# Patient Record
Sex: Female | Born: 1995 | Race: Black or African American | Hispanic: No | Marital: Single | Smoking: Never smoker
Health system: Southern US, Community
[De-identification: ages and names within clinical notes are randomized; demographics above are authoritative.]

## PROBLEM LIST (undated history)

## (undated) DIAGNOSIS — J45909 Unspecified asthma, uncomplicated: Secondary | ICD-10-CM

## (undated) DIAGNOSIS — L509 Urticaria, unspecified: Secondary | ICD-10-CM

## (undated) DIAGNOSIS — L309 Dermatitis, unspecified: Secondary | ICD-10-CM

## (undated) HISTORY — PX: HERNIA REPAIR: SHX51

## (undated) HISTORY — PX: HEMANGIOMA EXCISION: SHX1734

## (undated) HISTORY — DX: Urticaria, unspecified: L50.9

## (undated) HISTORY — DX: Dermatitis, unspecified: L30.9

---
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
## 2017-10-06 ENCOUNTER — Ambulatory Visit: Payer: 59 | Admitting: Allergy and Immunology

## 2017-10-06 ENCOUNTER — Encounter: Payer: Self-pay | Admitting: Allergy and Immunology

## 2017-10-06 VITALS — BP 90/66 | HR 85 | Temp 98.7°F | Resp 20 | Ht 59.84 in | Wt 152.6 lb

## 2017-10-06 DIAGNOSIS — L500 Allergic urticaria: Secondary | ICD-10-CM | POA: Insufficient documentation

## 2017-10-06 DIAGNOSIS — L299 Pruritus, unspecified: Secondary | ICD-10-CM

## 2017-10-06 DIAGNOSIS — J4530 Mild persistent asthma, uncomplicated: Secondary | ICD-10-CM

## 2017-10-06 DIAGNOSIS — J3089 Other allergic rhinitis: Secondary | ICD-10-CM

## 2017-10-06 MED ORDER — AZELASTINE HCL 0.1 % NA SOLN: NASAL | 5 refills | Status: DC

## 2017-10-06 MED ORDER — ALBUTEROL SULFATE HFA 108 (90 BASE) MCG/ACT IN AERS: INHALATION_SPRAY | RESPIRATORY_TRACT | 1 refills | Status: AC

## 2017-10-06 MED ORDER — MONTELUKAST SODIUM 10 MG PO TABS: 10.0000 mg | ORAL_TABLET | Freq: Every day | ORAL | 5 refills | Status: DC

## 2017-10-06 MED ORDER — CARBINOXAMINE MALEATE 6 MG PO TABS: 1.0000 | ORAL_TABLET | Freq: Three times a day (TID) | ORAL | 3 refills | Status: AC

## 2017-10-06 MED ORDER — MONTELUKAST SODIUM 10 MG PO TABS: ORAL_TABLET | ORAL | 5 refills | Status: DC

## 2017-10-06 MED ORDER — LEVOCETIRIZINE DIHYDROCHLORIDE 5 MG PO TABS: ORAL_TABLET | ORAL | 5 refills | Status: DC

## 2017-10-06 NOTE — Patient Instructions (Addendum)
Urticaria Unclear etiology. Skin tests to select food allergens were negative today.  Negative predictive value food allergen skin testing is excellent, approximately 95%.  The timing of symptom onset relative to the consumption of strawberry, approximately 12-18 hours later, makes this correlation unlikely. NSAIDs and emotional stress commonly exacerbate urticaria but are not the underlying etiology in this case. Physical urticarias are negative by history (i.e. pressure-induced, temperature, vibration, solar, etc.). There are no concomitant symptoms concerning for anaphylaxis or constitutional symptoms suggestive of underlying malignancy.   We will not order labs at this time, however, if lesions recur, persist, progress, or change in character, we will assess potential etiologies with screening labs.  For symptom relief, patient is to take oral antihistamines as directed.  A prescription has been provided for levocetirizine, 5 mg daily as needed.  Should symptoms recur, a journal is to be kept recording any foods eaten, beverages consumed, medications taken within a 6 hour period prior to the onset of symptoms, as well as record activities being performed, and environmental conditions. For any symptoms concerning for anaphylaxis, 911 is to be called immediately.  Perennial and seasonal allergic rhinitis  Aeroallergen avoidance measures have been discussed and provided in written form.  A prescription has been provided for azelastine nasal spray, 1-2 sprays per nostril 2 times daily as needed. Proper nasal spray technique has been discussed and demonstrated.   Nasal saline spray (i.e., Simply Saline) or nasal saline lavage (i.e., NeilMed) is recommended as needed and prior to medicated nasal sprays.  A prescription has been provided for RyVent (carbinoxamine maleate) 6mg  every 6-8 hours as needed.  If allergen avoidance measures and medications fail to adequately relieve symptoms, aeroallergen  immunotherapy will be considered.  Mild persistent asthma The patient's history suggests asthma and spirometry today confirms this diagnosis.  In addition, todays spirometry results, assessed while asymptomatic, suggest under-perception of bronchoconstriction.  A prescription has been provided for montelukast 10 mg daily at bedtime.  A prescription has been provided for albuterol HFA, 1-2 inhalations every 4-6 hours if needed.  Subjective and objective measures of pulmonary function will be followed and the treatment plan will be adjusted accordingly.   Return in about 3 months (around 01/06/2018), or if symptoms worsen or fail to improve.  Reducing Pollen Exposure  The American Academy of Allergy, Asthma and Immunology suggests the following steps to reduce your exposure to pollen during allergy seasons.    1. Do not hang sheets or clothing out to dry; pollen may collect on these items. 2. Do not mow lawns or spend time around freshly cut grass; mowing stirs up pollen. 3. Keep windows closed at night.  Keep car windows closed while driving. 4. Minimize morning activities outdoors, a time when pollen counts are usually at their highest. 5. Stay indoors as much as possible when pollen counts or humidity is high and on windy days when pollen tends to remain in the air longer. 6. Use air conditioning when possible.  Many air conditioners have filters that trap the pollen spores. 7. Use a HEPA room air filter to remove pollen form the indoor air you breathe.   Control of Mold Allergen  Mold and fungi can grow on a variety of surfaces provided certain temperature and moisture conditions exist.  Outdoor molds grow on plants, decaying vegetation and soil.  The major outdoor mold, Alternaria and Cladosporium, are found in very high numbers during hot and dry conditions.  Generally, a late Summer - Fall peak is seen  for common outdoor fungal spores.  Rain will temporarily lower outdoor mold spore  count, but counts rise rapidly when the rainy period ends.  The most important indoor molds are Aspergillus and Penicillium.  Dark, humid and poorly ventilated basements are ideal sites for mold growth.  The next most common sites of mold growth are the bathroom and the kitchen.  Outdoor MicrosoftMold Control 1. Use air conditioning and keep windows closed 2. Avoid exposure to decaying vegetation. 3. Avoid leaf raking. 4. Avoid grain handling. 5. Consider wearing a face mask if working in moldy areas.  Indoor Mold Control 1. Maintain humidity below 50%. 2. Clean washable surfaces with 5% bleach solution. 3. Remove sources e.g. Contaminated carpets.  Control of Dog or Cat Allergen  Avoidance is the best way to manage a dog or cat allergy. If you have a dog or cat and are allergic to dog or cats, consider removing the dog or cat from the home. If you have a dog or cat but don't want to find it a new home, or if your family wants a pet even though someone in the household is allergic, here are some strategies that may help keep symptoms at bay:  1. Keep the pet out of your bedroom and restrict it to only a few rooms. Be advised that keeping the dog or cat in only one room will not limit the allergens to that room. 2. Don't pet, hug or kiss the dog or cat; if you do, wash your hands with soap and water. 3. High-efficiency particulate air (HEPA) cleaners run continuously in a bedroom or living room can reduce allergen levels over time. 4. Place electrostatic material sheet in the air inlet vent in the bedroom. 5. Regular use of a high-efficiency vacuum cleaner or a central vacuum can reduce allergen levels. 6. Giving your dog or cat a bath at least once a week can reduce airborne allergen.

## 2017-10-06 NOTE — Assessment & Plan Note (Addendum)
Unclear etiology. Skin tests to select food allergens were negative today.  Negative predictive value food allergen skin testing is excellent, approximately 95%.  The timing of symptom onset relative to the consumption of strawberry, approximately 12-18 hours later, makes this correlation unlikely. NSAIDs and emotional stress commonly exacerbate urticaria but are not the underlying etiology in this case. Physical urticarias are negative by history (i.e. pressure-induced, temperature, vibration, solar, etc.). There are no concomitant symptoms concerning for anaphylaxis or constitutional symptoms suggestive of underlying malignancy.   We will not order labs at this time, however, if lesions recur, persist, progress, or change in character, we will assess potential etiologies with screening labs.  For symptom relief, patient is to take oral antihistamines as directed.  A prescription has been provided for levocetirizine, 5 mg daily as needed.  Should symptoms recur, a journal is to be kept recording any foods eaten, beverages consumed, medications taken within a 6 hour period prior to the onset of symptoms, as well as record activities being performed, and environmental conditions. For any symptoms concerning for anaphylaxis, 911 is to be called immediately.

## 2017-10-06 NOTE — Progress Notes (Signed)
New Patient Note  RE: Ariel SellersBrea Pelissier MRN: [id number] DOB: [birth year] Date of Office Visit: 10/06/2017  Referring provider: No ref. provider found Primary care provider: No primary care provider on file.  Chief Complaint: Allergic Reaction; Pruritus; Sinus Problem; and Breathing Problem   History of present illness: Ariel Brooks is a 21 y.o. female presenting today for evaluation of possible food allergies, rhinosinusitis, and breathing problems. She reports that approximately 2 weeks ago she woke up in the morning "itching all over."  She denies urticaria, angioedema, cardiopulmonary symptoms, and GI symptoms.  The pruritus lasted for approximately 3 days.  She took cetirizine with mild/moderate benefit.  No specific medication, food, skin care product, detergent, soap, or other environmental triggers have been identified.  The pruritus has not recurred since that time.  Approximately 5 years ago, she woke up one morning with hives "all over" her body.  The hives resolved over the next 12-24 hours with cetirizine.  No specific triggers were identified, however she notes that she had consumed more strawberries and usual in the days leading up to the urticaria.  She did not experience concomitant cardiopulmonary or GI symptoms during this episode. She complains of persistent nasal congestion and occasional sinus pressure over the forehead and cheekbones.  The nasal congestion occurs year around without significant seasonal variation, however is worse at nighttime.  No specific environmental triggers have been identified.  The sinus pressure tends to be more frequent and severe during the winter and with rapid weather changes.  She states that she has 3 or 4 sinus infections per year on average requiring antibiotics or steroids. Over the past year or so she has noticed that she is "breathing a lot heavier" even with mild exertion.  In addition, she experiences chest tightness 2 or 3 nights per week on  average.  She denies wheezing.  She currently does not have an albuterol inhaler.  Her brother has asthma.   Assessment and plan: Urticaria Unclear etiology. Skin tests to select food allergens were negative today.  Negative predictive value food allergen skin testing is excellent, approximately 95%.  The timing of symptom onset relative to the consumption of strawberry, approximately 12-18 hours later, makes this correlation unlikely. NSAIDs and emotional stress commonly exacerbate urticaria but are not the underlying etiology in this case. Physical urticarias are negative by history (i.e. pressure-induced, temperature, vibration, solar, etc.). There are no concomitant symptoms concerning for anaphylaxis or constitutional symptoms suggestive of underlying malignancy.   We will not order labs at this time, however, if lesions recur, persist, progress, or change in character, we will assess potential etiologies with screening labs.  For symptom relief, patient is to take oral antihistamines as directed.  A prescription has been provided for levocetirizine, 5 mg daily as needed.  Should symptoms recur, a journal is to be kept recording any foods eaten, beverages consumed, medications taken within a 6 hour period prior to the onset of symptoms, as well as record activities being performed, and environmental conditions. For any symptoms concerning for anaphylaxis, 911 is to be called immediately.  Perennial and seasonal allergic rhinitis  Aeroallergen avoidance measures have been discussed and provided in written form.  A prescription has been provided for azelastine nasal spray, 1-2 sprays per nostril 2 times daily as needed. Proper nasal spray technique has been discussed and demonstrated.   Nasal saline spray (i.e., Simply Saline) or nasal saline lavage (i.e., NeilMed) is recommended as needed and prior to medicated nasal sprays.  A prescription has been provided for RyVent (carbinoxamine  maleate) 6mg  every 6-8 hours as needed.  If allergen avoidance measures and medications fail to adequately relieve symptoms, aeroallergen immunotherapy will be considered.  Mild persistent asthma The patient's history suggests asthma and spirometry today confirms this diagnosis.  In addition, todays spirometry results, assessed while asymptomatic, suggest under-perception of bronchoconstriction.  A prescription has been provided for montelukast 10 mg daily at bedtime.  A prescription has been provided for albuterol HFA, 1-2 inhalations every 4-6 hours if needed.  Subjective and objective measures of pulmonary function will be followed and the treatment plan will be adjusted accordingly.   Meds ordered this encounter  Medications  . montelukast (SINGULAIR) 10 MG tablet    Sig: Take 1 tablet (10 mg total) at bedtime by mouth.    Dispense:  30 tablet    Refill:  5  . albuterol (PROAIR HFA) 108 (90 Base) MCG/ACT inhaler    Sig: 2 puffs every 4-6 as needed for coughing or wheezing.    Dispense:  1 Inhaler    Refill:  1  . levocetirizine (XYZAL) 5 MG tablet    Sig: 1 tablet once daily for runny nose or itching.    Dispense:  30 tablet    Refill:  5  . montelukast (SINGULAIR) 10 MG tablet    Sig: 1 tablet at bed time for coughing or wheezing.    Dispense:  30 tablet    Refill:  5  . azelastine (ASTELIN) 0.1 % nasal spray    Sig: 1-2 sprays per nostril twice daily as needed for runny nose.    Dispense:  30 mL    Refill:  5  . Carbinoxamine Maleate (RYVENT) 6 MG TABS    Sig: Take 1 tablet 3 (three) times daily by mouth.    Dispense:  30 tablet    Refill:  3    Put in as cash pay. Pt. Has coupon.    Diagnostics: Spirometry: FVC was 2.34 L (82% predicted) and FEV1 was 1.89 L (74% predicted) with significant (280 mL, 15%) postbronchodilator improvement.  This study was performed while the patient was asymptomatic.  Please see scanned spirometry results for details. Environmental  skin testing: Positive to grass pollen, weed pollen, ragweed pollen, tree pollen, molds, and cat hair. Food allergen skin testing: Negative despite a positive histamine control.    Physical examination: Blood pressure 90/66, pulse 85, temperature 98.7 F (37.1 C), temperature source Oral, resp. rate 20, height 4' 11.84" (1.52 m), weight 152 lb 8.9 oz (69.2 kg), SpO2 98 %.  General: Alert, interactive, in no acute distress. HEENT: TMs pearly gray, turbinates edematous and pale with clear discharge, post-pharynx mildly erythematous. Neck: Supple without lymphadenopathy. Lungs: Clear to auscultation without wheezing, rhonchi or rales. CV: Normal S1, S2 without murmurs. Abdomen: Nondistended, nontender. Skin: Warm and dry, without lesions or rashes. Extremities:  No clubbing, cyanosis or edema. Neuro:   Grossly intact.  Review of systems:  Review of systems negative except as noted in HPI / PMHx or noted below: Review of Systems  Constitutional: Negative.   HENT: Negative.   Eyes: Negative.   Respiratory: Negative.   Cardiovascular: Negative.   Gastrointestinal: Negative.   Genitourinary: Negative.   Musculoskeletal: Negative.   Skin: Negative.   Neurological: Negative.   Endo/Heme/Allergies: Negative.   Psychiatric/Behavioral: Negative.     Past medical history:  Past Medical History:  Diagnosis Date  . Eczema   . Urticaria  Past surgical history:  Past Surgical History:  Procedure Laterality Date  . HEMANGIOMA EXCISION Left   . WISDOM TOOTH EXTRACTION  11/2016    Family history: Family History  Problem Relation Age of Onset  . Asthma Brother   . Eczema Brother   . Food Allergy Brother   . Urticaria Brother   . Lupus Maternal Grandmother   . Allergic rhinitis Neg Hx   . Angioedema Neg Hx   . Immunodeficiency Neg Hx     Social history: Social History   Socioeconomic History  . Marital status: Not on file    Spouse name: Not on file  . Number of  children: Not on file  . Years of education: Not on file  . Highest education level: Not on file  Social Needs  . Financial resource strain: Not on file  . Food insecurity - worry: Not on file  . Food insecurity - inability: Not on file  . Transportation needs - medical: Not on file  . Transportation needs - non-medical: Not on file  Occupational History  . Not on file  Tobacco Use  . Smoking status: Never Smoker  . Smokeless tobacco: Never Used  Substance and Sexual Activity  . Alcohol use: No    Frequency: Never  . Drug use: No  . Sexual activity: Not on file  Other Topics Concern  . Not on file  Social History Narrative  . Not on file   Environmental History: The patient lives in a 19 year old house with carpeting throughout, gas heat, and central air.  There is no known mold/water damage in the home.  She is a non-smoker without pets.  Allergies as of 10/06/2017      Reactions   Benadryl Allergy-sinus Headach  [diphenhydramine-pe-apap] Other (See Comments)   Ciprofloxacin Rash      Medication List        Accurate as of 10/06/17  1:31 PM. Always use your most recent med list.          albuterol 108 (90 Base) MCG/ACT inhaler Commonly known as:  PROAIR HFA 2 puffs every 4-6 as needed for coughing or wheezing.   azelastine 0.1 % nasal spray Commonly known as:  ASTELIN 1 spray by Each Nare route Two (2) times a day. Use in each nostril as directed   azelastine 0.1 % nasal spray Commonly known as:  ASTELIN 1-2 sprays per nostril twice daily as needed for runny nose.   Carbinoxamine Maleate 6 MG Tabs Commonly known as:  RYVENT Take 1 tablet 3 (three) times daily by mouth.   levocetirizine 5 MG tablet Commonly known as:  XYZAL 1 tablet once daily for runny nose or itching.   montelukast 10 MG tablet Commonly known as:  SINGULAIR Take 1 tablet (10 mg total) at bedtime by mouth.   montelukast 10 MG tablet Commonly known as:  SINGULAIR 1 tablet at bed time  for coughing or wheezing.   norethindrone-ethinyl estradiol 1-20 MG-MCG tablet Commonly known as:  JUNEL FE,GILDESS FE,LOESTRIN FE Take by mouth.   triamcinolone cream 0.1 % Commonly known as:  KENALOG Apply to affected area 2-3 times daily as needed for rash.       Known medication allergies: Allergies  Allergen Reactions  . Benadryl Allergy-Sinus Headach  [Diphenhydramine-Pe-Apap] Other (See Comments)  . Ciprofloxacin Rash    I appreciate the opportunity to take part in Dory's care. Please do not hesitate to contact me with questions.  Sincerely,   R. [employer],  MD

## 2017-10-06 NOTE — Assessment & Plan Note (Signed)
   Aeroallergen avoidance measures have been discussed and provided in written form.  A prescription has been provided for azelastine nasal spray, 1-2 sprays per nostril 2 times daily as needed. Proper nasal spray technique has been discussed and demonstrated.   Nasal saline spray (i.e., Simply Saline) or nasal saline lavage (i.e., NeilMed) is recommended as needed and prior to medicated nasal sprays.  A prescription has been provided for RyVent (carbinoxamine maleate) 6mg  every 6-8 hours as needed.  If allergen avoidance measures and medications fail to adequately relieve symptoms, aeroallergen immunotherapy will be considered.

## 2017-10-06 NOTE — Assessment & Plan Note (Signed)
The patient's history suggests asthma and spirometry today confirms this diagnosis.  In addition, todays spirometry results, assessed while asymptomatic, suggest under-perception of bronchoconstriction.  A prescription has been provided for montelukast 10 mg daily at bedtime.  A prescription has been provided for albuterol HFA, 1-2 inhalations every 4-6 hours if needed.  Subjective and objective measures of pulmonary function will be followed and the treatment plan will be adjusted accordingly.

## 2018-01-05 ENCOUNTER — Encounter: Payer: Self-pay | Admitting: Allergy and Immunology

## 2018-01-05 ENCOUNTER — Ambulatory Visit: Payer: 59 | Admitting: Allergy and Immunology

## 2018-01-05 VITALS — BP 100/70 | HR 92 | Temp 98.5°F | Resp 20

## 2018-01-05 DIAGNOSIS — J3089 Other allergic rhinitis: Secondary | ICD-10-CM | POA: Diagnosis not present

## 2018-01-05 DIAGNOSIS — J4530 Mild persistent asthma, uncomplicated: Secondary | ICD-10-CM | POA: Diagnosis not present

## 2018-01-05 MED ORDER — MONTELUKAST SODIUM 10 MG PO TABS: 10.0000 mg | ORAL_TABLET | Freq: Every day | ORAL | 5 refills | Status: AC

## 2018-01-05 NOTE — Assessment & Plan Note (Signed)
Stable.  Continue appropriate allergen avoidance measures, montelukast daily, RyVent if needed azelastine if needed, and nasal saline irrigation if needed.

## 2018-01-05 NOTE — Progress Notes (Signed)
Follow-up Note  RE: Ariel SellersBrea Brooks MRN: [id number] DOB: May 07, 1996 Date of Office Visit: 01/05/2018  Primary care provider: Kerin Salen'Connor, Lauren Elizabeth, PA-C Referring provider: No ref. provider found  History of present illness: Ariel SellersBrea Brooks is a 22 y.o. female with persistent asthma, allergic rhinitis, and history of urticaria presenting today for follow-up.  She was previously seen in this clinic for her initial evaluation on October 06, 2017.  She reports that approximately 3 weeks ago she had nasal congestion, postnasal drainage, sinus pressure, and chest tightness.  She went to her primary care physician's office last week and was given a breathing treatment after wheezing was auscultated on the examination.  She was also prescribed azithromycin and reports that her upper and lower respiratory symptoms and currently she is without symptoms.  She reports that she has had no recurrence of hives.   Assessment and plan: Mild persistent asthma Well-controlled.   For now, continue montelukast 10 mg daily at bedtime the end albuterol HFA, 1-2 inhalations every 6 hours if needed.  Subjective and objective measures of pulmonary function will be followed and the treatment plan will be adjusted accordingly.  Perennial and seasonal allergic rhinitis Stable.  Continue appropriate allergen avoidance measures, montelukast daily, RyVent if needed azelastine if needed, and nasal saline irrigation if needed.   Meds ordered this encounter  Medications  . montelukast (SINGULAIR) 10 MG tablet    Sig: Take 1 tablet (10 mg total) by mouth at bedtime.    Dispense:  30 tablet    Refill:  5    Diagnostics: Spirometry reveals an FVC of 2.64 L (93% predicted), FEV1 of 2.13 L (83% predicted), and FEV1 ratio of 92%.  Please see scanned spirometry results for details.    Physical examination: Blood pressure 100/70, pulse 92, temperature 98.5 F (36.9 C), temperature source Oral, resp. rate 20, SpO2 98  %.  General: Alert, interactive, in no acute distress. HEENT: TMs pearly gray, turbinates minimally edematous without discharge, post-pharynx unremarkable. Neck: Supple without lymphadenopathy. Lungs: Clear to auscultation without wheezing, rhonchi or rales. CV: Normal S1, S2 without murmurs. Skin: Warm and dry, without lesions or rashes.  The following portions of the patient's history were reviewed and updated as appropriate: allergies, current medications, past family history, past medical history, past social history, past surgical history and problem list.  Allergies as of 01/05/2018      Reactions   Benadryl Allergy-sinus Headach  [diphenhydramine-pe-apap] Other (See Comments)   Ciprofloxacin Rash   Prednisone Itching      Medication List        Accurate as of 01/05/18  4:14 PM. Always use your most recent med list.          albuterol 108 (90 Base) MCG/ACT inhaler Commonly known as:  PROAIR HFA 2 puffs every 4-6 as needed for coughing or wheezing.   Carbinoxamine Maleate 6 MG Tabs Commonly known as:  RYVENT Take 1 tablet 3 (three) times daily by mouth.   montelukast 10 MG tablet Commonly known as:  SINGULAIR Take 1 tablet (10 mg total) by mouth at bedtime.   norethindrone-ethinyl estradiol 1-20 MG-MCG tablet Commonly known as:  JUNEL FE,GILDESS FE,LOESTRIN FE Take by mouth.   triamcinolone cream 0.1 % Commonly known as:  KENALOG Apply to affected area 2-3 times daily as needed for rash.       Allergies  Allergen Reactions  . Benadryl Allergy-Sinus Headach  [Diphenhydramine-Pe-Apap] Other (See Comments)  . Ciprofloxacin Rash  . Prednisone Itching  I appreciate the opportunity to take part in Ariel Brooks's care. Please do not hesitate to contact me with questions.  Sincerely,   R. Jorene Guest, MD

## 2018-01-05 NOTE — Assessment & Plan Note (Signed)
Well-controlled.   For now, continue montelukast 10 mg daily at bedtime the end albuterol HFA, 1-2 inhalations every 6 hours if needed.  Subjective and objective measures of pulmonary function will be followed and the treatment plan will be adjusted accordingly.

## 2018-01-05 NOTE — Patient Instructions (Signed)
Mild persistent asthma Well-controlled.   For now, continue montelukast 10 mg daily at bedtime the end albuterol HFA, 1-2 inhalations every 6 hours if needed.  Subjective and objective measures of pulmonary function will be followed and the treatment plan will be adjusted accordingly.  Perennial and seasonal allergic rhinitis Stable.  Continue appropriate allergen avoidance measures, montelukast daily, RyVent if needed azelastine if needed, and nasal saline irrigation if needed.   Return in about 5 months (around 06/04/2018), or if symptoms worsen or fail to improve.

## 2018-06-08 ENCOUNTER — Encounter: Payer: Self-pay | Admitting: Allergy and Immunology

## 2018-06-08 ENCOUNTER — Other Ambulatory Visit: Payer: Self-pay

## 2018-06-08 ENCOUNTER — Ambulatory Visit: Payer: 59 | Admitting: Allergy and Immunology

## 2018-06-08 DIAGNOSIS — J4530 Mild persistent asthma, uncomplicated: Secondary | ICD-10-CM | POA: Diagnosis not present

## 2018-06-08 DIAGNOSIS — L500 Allergic urticaria: Secondary | ICD-10-CM

## 2018-06-08 DIAGNOSIS — J3089 Other allergic rhinitis: Secondary | ICD-10-CM

## 2018-06-08 NOTE — Patient Instructions (Signed)
Mild persistent asthma Currently well controlled.   For now, continue montelukast 10 mg daily at bedtime the end albuterol HFA, 1-2 inhalations every 6 hours if needed.  Subjective and objective measures of pulmonary function will be followed and the treatment plan will be adjusted accordingly.  Perennial and seasonal allergic rhinitis Stable.  Continue appropriate allergen avoidance measures, montelukast daily, fluticasone nasal spray if needed, and nasal saline irrigation if needed.  If allergen avoidance measures and medications fail to adequately relieve symptoms, aeroallergen immunotherapy will be considered.  Urticaria Quiescent.  If urticaria recurs, take over-the-counter antihistamines and keep a symptom/exposure journal.  If symptoms concerning for anaphylaxis arise, 911 is to be called immediately.   Return in about 6 months (around 12/09/2018), or if symptoms worsen or fail to improve.

## 2018-06-08 NOTE — Assessment & Plan Note (Addendum)
Quiescent.  If urticaria recurs, take over-the-counter antihistamines and keep a symptom/exposure journal.  If symptoms concerning for anaphylaxis arise, 911 is to be called immediately.

## 2018-06-08 NOTE — Assessment & Plan Note (Signed)
Stable.  Continue appropriate allergen avoidance measures, montelukast daily, fluticasone nasal spray if needed, and nasal saline irrigation if needed.  If allergen avoidance measures and medications fail to adequately relieve symptoms, aeroallergen immunotherapy will be considered.

## 2018-06-08 NOTE — Progress Notes (Signed)
Follow-up Note  RE: Odaly Peri MRN: [id number] DOB: June 27, 1996 Date of Office Visit: 06/08/2018  Primary care provider: Kerin Salen, PA-C Referring provider: Kathaleen Bury*  History of present illness: Iran Kievit is a 22 y.o. female with persistent asthma, allergic rhinitis, and history of urticaria presenting today for follow-up.  She was last seen in this clinic on January 05, 2018.  She reports that overall her upper and lower respiratory symptoms have been well controlled.  She did have a bout of bronchitis approximately 1 month ago requiring a trip to her primary care physician's office for a neb treatment.  She was sent home with a nebulizer for albuterol treatments at home if needed.  Otherwise, her asthma has been well controlled with montelukast 10 mg daily.  She rarely requires albuterol rescue, denies limitations in normal activities, and denies nocturnal awakenings due to lower respiratory symptoms.  Her nasal allergy symptoms are well controlled with fluticasone nasal spray as needed.  Assessment and plan: Mild persistent asthma Currently well controlled.   For now, continue montelukast 10 mg daily at bedtime the end albuterol HFA, 1-2 inhalations every 6 hours if needed.  Subjective and objective measures of pulmonary function will be followed and the treatment plan will be adjusted accordingly.  Perennial and seasonal allergic rhinitis Stable.  Continue appropriate allergen avoidance measures, montelukast daily, fluticasone nasal spray if needed, and nasal saline irrigation if needed.  If allergen avoidance measures and medications fail to adequately relieve symptoms, aeroallergen immunotherapy will be considered.  Urticaria Quiescent.  If urticaria recurs, take over-the-counter antihistamines and keep a symptom/exposure journal.  If symptoms concerning for anaphylaxis arise, 911 is to be called immediately.  Diagnostics: Spirometry:   Normal with an FEV1 of 99% predicted.  Please see scanned spirometry results for details.    Physical examination: Blood pressure 106/68, pulse 88, temperature 99.2 F (37.3 C), temperature source Oral, resp. rate 18, SpO2 97 %.  General: Alert, interactive, in no acute distress. HEENT: TMs pearly gray, turbinates moderately edematous without discharge, post-pharynx unremarkable. Neck: Supple without lymphadenopathy. Lungs: Clear to auscultation without wheezing, rhonchi or rales. CV: Normal S1, S2 without murmurs. Skin: Warm and dry, without lesions or rashes.  The following portions of the patient's history were reviewed and updated as appropriate: allergies, current medications, past family history, past medical history, past social history, past surgical history and problem list.  Allergies as of 06/08/2018      Reactions   Benadryl Allergy-sinus Headach  [diphenhydramine-pe-apap] Other (See Comments)   Ciprofloxacin Rash   Prednisone Itching      Medication List        Accurate as of 06/08/18  4:12 PM. Always use your most recent med list.          albuterol 108 (90 Base) MCG/ACT inhaler Commonly known as:  PROAIR HFA 2 puffs every 4-6 as needed for coughing or wheezing.   Carbinoxamine Maleate 6 MG Tabs Commonly known as:  RYVENT Take 1 tablet 3 (three) times daily by mouth.   levocetirizine 5 MG tablet Commonly known as:  XYZAL Take 5 mg by mouth daily as needed for allergies.   montelukast 10 MG tablet Commonly known as:  SINGULAIR Take 1 tablet (10 mg total) by mouth at bedtime.   norethindrone-ethinyl estradiol 1-20 MG-MCG tablet Commonly known as:  JUNEL FE,GILDESS FE,LOESTRIN FE Take by mouth.   triamcinolone cream 0.1 % Commonly known as:  KENALOG Apply to affected area 2-3 times daily  as needed for rash.       Allergies  Allergen Reactions  . Benadryl Allergy-Sinus Headach  [Diphenhydramine-Pe-Apap] Other (See Comments)  . Ciprofloxacin Rash    . Prednisone Itching    I appreciate the opportunity to take part in Emaree's care. Please do not hesitate to contact me with questions.  Sincerely,   R. Jorene Guestarter Jesenia Spera, MD

## 2018-06-08 NOTE — Assessment & Plan Note (Signed)
Currently well controlled.   For now, continue montelukast 10 mg daily at bedtime the end albuterol HFA, 1-2 inhalations every 6 hours if needed.  Subjective and objective measures of pulmonary function will be followed and the treatment plan will be adjusted accordingly.

## 2018-06-12 NOTE — Addendum Note (Signed)
Addended by: Berna BueWHITAKER, CARRIE L on: 06/12/2018 03:36 PM   Modules accepted: Orders

## 2018-07-09 ENCOUNTER — Other Ambulatory Visit: Payer: Self-pay | Admitting: Allergy and Immunology

## 2018-07-09 DIAGNOSIS — L500 Allergic urticaria: Secondary | ICD-10-CM

## 2018-12-13 ENCOUNTER — Ambulatory Visit: Payer: 59 | Admitting: Allergy and Immunology

## 2019-02-21 ENCOUNTER — Other Ambulatory Visit: Payer: Self-pay | Admitting: Allergy and Immunology

## 2019-03-07 ENCOUNTER — Other Ambulatory Visit: Payer: Self-pay | Admitting: Allergy and Immunology

## 2019-03-09 ENCOUNTER — Other Ambulatory Visit: Payer: Self-pay | Admitting: Allergy and Immunology

## 2021-04-13 ENCOUNTER — Ambulatory Visit: Payer: 59 | Admitting: Family Medicine

## 2021-10-03 ENCOUNTER — Other Ambulatory Visit: Payer: Self-pay

## 2021-10-03 ENCOUNTER — Encounter (HOSPITAL_BASED_OUTPATIENT_CLINIC_OR_DEPARTMENT_OTHER): Payer: Self-pay | Admitting: *Deleted

## 2021-10-03 ENCOUNTER — Emergency Department (HOSPITAL_BASED_OUTPATIENT_CLINIC_OR_DEPARTMENT_OTHER)
Admission: EM | Admit: 2021-10-03 | Discharge: 2021-10-03 | Disposition: A | Discharge status: Home / Self Care | Payer: BC Managed Care – PPO | Attending: Emergency Medicine | Admitting: Emergency Medicine

## 2021-10-03 ENCOUNTER — Emergency Department (HOSPITAL_BASED_OUTPATIENT_CLINIC_OR_DEPARTMENT_OTHER): Payer: BC Managed Care – PPO

## 2021-10-03 DIAGNOSIS — R0602 Shortness of breath: Secondary | ICD-10-CM

## 2021-10-03 DIAGNOSIS — J4530 Mild persistent asthma, uncomplicated: Secondary | ICD-10-CM | POA: Insufficient documentation

## 2021-10-03 DIAGNOSIS — Z20822 Contact with and (suspected) exposure to covid-19: Secondary | ICD-10-CM | POA: Diagnosis not present

## 2021-10-03 DIAGNOSIS — J40 Bronchitis, not specified as acute or chronic: Secondary | ICD-10-CM | POA: Diagnosis not present

## 2021-10-03 HISTORY — DX: Unspecified asthma, uncomplicated: J45.909

## 2021-10-03 LAB — CBC WITH DIFFERENTIAL/PLATELET
Abs Immature Granulocytes: 0.05 10*3/uL (ref 0.00–0.07)
Basophils Absolute: 0 10*3/uL (ref 0.0–0.1)
Basophils Relative: 0 %
Eosinophils Absolute: 0 10*3/uL (ref 0.0–0.5)
Eosinophils Relative: 1 %
HCT: 44.5 % (ref 36.0–46.0)
Hemoglobin: 15.4 g/dL — ABNORMAL HIGH (ref 12.0–15.0)
Immature Granulocytes: 1 %
Lymphocytes Relative: 48 %
Lymphs Abs: 2.6 10*3/uL (ref 0.7–4.0)
MCH: 29.9 pg (ref 26.0–34.0)
MCHC: 34.6 g/dL (ref 30.0–36.0)
MCV: 86.4 fL (ref 80.0–100.0)
Monocytes Absolute: 0.3 10*3/uL (ref 0.1–1.0)
Monocytes Relative: 6 %
Neutro Abs: 2.4 10*3/uL (ref 1.7–7.7)
Neutrophils Relative %: 44 %
Platelets: 148 10*3/uL — ABNORMAL LOW (ref 150–400)
RBC: 5.15 MIL/uL — ABNORMAL HIGH (ref 3.87–5.11)
RDW: 12.2 % (ref 11.5–15.5)
WBC: 5.4 10*3/uL (ref 4.0–10.5)
nRBC: 0 % (ref 0.0–0.2)

## 2021-10-03 LAB — BASIC METABOLIC PANEL
Anion gap: 8 (ref 5–15)
BUN: 7 mg/dL (ref 6–20)
CO2: 24 mmol/L (ref 22–32)
Calcium: 9.1 mg/dL (ref 8.9–10.3)
Chloride: 105 mmol/L (ref 98–111)
Creatinine, Ser: 0.76 mg/dL (ref 0.44–1.00)
GFR, Estimated: >60 mL/min (ref >60)
Glucose, Bld: 84 mg/dL (ref 70–99)
Potassium: 4.2 mmol/L (ref 3.5–5.1)
Sodium: 137 mmol/L (ref 135–145)

## 2021-10-03 LAB — HEPATIC FUNCTION PANEL
ALT: 38 U/L (ref 0–44)
AST: 28 U/L (ref 15–41)
Albumin: 4.4 g/dL (ref 3.5–5.0)
Alkaline Phosphatase: 43 U/L (ref 38–126)
Bilirubin, Direct: 0.1 mg/dL (ref 0.0–0.2)
Indirect Bilirubin: 0.5 mg/dL (ref 0.3–0.9)
Total Bilirubin: 0.6 mg/dL (ref 0.3–1.2)
Total Protein: 7.9 g/dL (ref 6.5–8.1)

## 2021-10-03 LAB — D-DIMER, QUANTITATIVE: D-Dimer, Quant: 0.32 ug/mL-FEU (ref 0.00–0.50)

## 2021-10-03 LAB — RESP PANEL BY RT-PCR (FLU A&B, COVID) ARPGX2
Influenza A by PCR: NEGATIVE
Influenza B by PCR: NEGATIVE
SARS Coronavirus 2 by RT PCR: NEGATIVE

## 2021-10-03 LAB — PREGNANCY, URINE: Preg Test, Ur: NEGATIVE

## 2021-10-03 NOTE — ED Provider Notes (Signed)
[facility] EMERGENCY DEPARTMENT Provider Note   CSN: [id number] Arrival date & time: 10/03/21  1938     History Chief Complaint  Patient presents with   Shortness of Breath    Ariel Brooks is a 25 y.o. female.   Shortness of Breath Associated symptoms: cough   Associated symptoms: no abdominal pain, no chest pain, no ear pain, no fever, no rash, no sore throat and no vomiting    25 year old female presenting to the emergency department with a history of asthma and recent diagnosis of influenza 1 week ago, started on Tamiflu and nasal saline spray presenting to the emergency department with worsening shortness of breath.  The patient states that she has had persistent cough that is mildly productive of sputum, shortness of breath and left-sided back pain that has been present for the past week and unchanged since this past Tuesday.  She is on estrogen containing contraceptive pills.  She denies any history of blood clots.  She has been taking an albuterol inhaler as needed for her shortness of breath due to her history of asthma.  She denies any fevers or chills.  Past Medical History:  Diagnosis Date   Asthma    Eczema    Urticaria     Patient Active Problem List   Diagnosis Date Noted   Urticaria 10/06/2017   Pruritus 10/06/2017   Perennial and seasonal allergic rhinitis 10/06/2017   Mild persistent asthma 10/06/2017    Past Surgical History:  Procedure Laterality Date   HEMANGIOMA EXCISION Left    HERNIA REPAIR     WISDOM TOOTH EXTRACTION  11/2016     OB History   No obstetric history on file.     Family History  Problem Relation Age of Onset   Asthma Brother    Eczema Brother    Food Allergy Brother    Urticaria Brother    Lupus Maternal Grandmother    Allergic rhinitis Neg Hx    Angioedema Neg Hx    Immunodeficiency Neg Hx     Social History   Tobacco Use   Smoking status: Never   Smokeless tobacco: Never  Vaping Use   Vaping Use:  Never used  Substance Use Topics   Alcohol use: No   Drug use: No    Home Medications Prior to Admission medications   Medication Sig Start Date End Date Taking? Authorizing Provider  albuterol (PROAIR HFA) 108 (90 Base) MCG/ACT inhaler 2 puffs every 4-6 as needed for coughing or wheezing. 10/06/17   Bobbitt, Heywood Iles, MD  Carbinoxamine Maleate (RYVENT) 6 MG TABS Take 1 tablet 3 (three) times daily by mouth. 10/06/17   Bobbitt, Heywood Iles, MD  levocetirizine (XYZAL) 5 MG tablet Take 5 mg by mouth daily as needed for allergies.    [provider]  levocetirizine (XYZAL) 5 MG tablet TAKE 1 TABLET ONCE DAILY FOR RUNNY NOSE OR ITCHING. 07/10/18   Bobbitt, Heywood Iles, MD  levocetirizine (XYZAL) 5 MG tablet TAKE 1 TABLET ONCE DAILY FOR RUNNY NOSE OR ITCHING. 02/21/19   Bobbitt, Heywood Iles, MD  montelukast (SINGULAIR) 10 MG tablet Take 1 tablet (10 mg total) by mouth at bedtime. 01/05/18   Bobbitt, Heywood Iles, MD  norethindrone-ethinyl estradiol (JUNEL FE,GILDESS FE,LOESTRIN FE) 1-20 MG-MCG tablet Take by mouth. 11/19/16 11/19/17  [provider]  triamcinolone cream (KENALOG) 0.1 % Apply to affected area 2-3 times daily as needed for rash. 08/18/16   [provider]    Allergies  Benadryl allergy-sinus headach  [diphenhydramine-pe-apap], Ciprofloxacin, and Prednisone  Review of Systems   Review of Systems  Constitutional:  Negative for chills and fever.  HENT:  Negative for ear pain and sore throat.   Eyes:  Negative for pain and visual disturbance.  Respiratory:  Positive for cough and shortness of breath.   Cardiovascular:  Negative for chest pain and palpitations.  Gastrointestinal:  Negative for abdominal pain and vomiting.  Genitourinary:  Negative for dysuria and hematuria.  Musculoskeletal:  Negative for arthralgias and back pain.  Skin:  Negative for color change and rash.  Neurological:  Negative for seizures and syncope.  All other systems  reviewed and are negative.  Physical Exam Updated Vital Signs BP 116/84 (BP Location: Left Arm)   Pulse 80   Temp 98.6 F (37 C)   Resp 16   Ht 4\' 11"  (1.499 m)   Wt 62.6 kg   LMP 09/16/2021   SpO2 100%   BMI 27.87 kg/m   Physical Exam Vitals and nursing note reviewed.  Constitutional:      General: She is not in acute distress.    Appearance: She is well-developed.  HENT:     Head: Normocephalic and atraumatic.  Eyes:     Conjunctiva/sclera: Conjunctivae normal.  Cardiovascular:     Rate and Rhythm: Normal rate and regular rhythm.     Heart sounds: No murmur heard. Pulmonary:     Effort: Pulmonary effort is normal. No tachypnea or respiratory distress.     Breath sounds: Normal breath sounds. No decreased breath sounds, wheezing, rhonchi or rales.  Abdominal:     Palpations: Abdomen is soft.     Tenderness: There is no abdominal tenderness.  Musculoskeletal:     Cervical back: Neck supple.  Skin:    General: Skin is warm and dry.  Neurological:     Mental Status: She is alert.    ED Results / Procedures / Treatments   Labs (all labs ordered are listed, but only abnormal results are displayed) Labs Reviewed  CBC WITH DIFFERENTIAL/PLATELET - Abnormal; Notable for the following components:      Result Value   RBC 5.15 (*)    Hemoglobin 15.4 (*)    Platelets 148 (*)    All other components within normal limits  RESP PANEL BY RT-PCR (FLU A&B, COVID) ARPGX2  BASIC METABOLIC PANEL  HEPATIC FUNCTION PANEL  PREGNANCY, URINE  D-DIMER, QUANTITATIVE (NOT AT [facility], [facility], [facility])    EKG None  Radiology DG Chest 2 View  Result Date: 10/03/2021 CLINICAL DATA:  Cough, shortness of breath EXAM: CHEST - 2 VIEW COMPARISON:  None. FINDINGS: Lungs are clear.  No pleural effusion or pneumothorax. The heart is normal in size. Visualized osseous structures are within normal limits. IMPRESSION: Normal chest radiographs. Electronically Signed   By: Julian Hy M.D.   On: 10/03/2021  21:37    Procedures Procedures   Medications Ordered in ED Medications - No data to display  ED Course  I have reviewed the triage vital signs and the nursing notes.  Pertinent labs & imaging results that were available during my care of the patient were reviewed by me and considered in my medical decision making (see chart for details).    MDM Rules/Calculators/A&P                           25 year old female presenting to the emergency department with a history of asthma and recent diagnosis  of influenza 1 week ago, started on Tamiflu and nasal saline spray presenting to the emergency department with worsening shortness of breath.  The patient states that she has had persistent cough that is mildly productive of sputum, shortness of breath and left-sided back pain that has been present for the past week and unchanged since this past Tuesday.  She is on estrogen containing contraceptive pills.  She denies any history of blood clots.  She has been taking an albuterol inhaler as needed for her shortness of breath due to her history of asthma.  She denies any fevers or chills.  On arrival, the patient was afebrile, hemodynamically stable, not tachycardic or tachypneic, saturating well on room air.  Physical exam significant for lungs that were clear to auscultation in all lung fields, no wheezing, rales or rhonchi.  The patient presents with a worsening productive cough after a telemetry doc diagnosis of influenza.  She has been treated with Tamiflu and has been attempting symptomatic management at home and presents with persistent dyspnea and productive cough.  Differential diagnosis includes post influenza pneumonia, persistent viral URI, PE, acute bronchitis.  Work-up initial reviewed significant for CBC without a leukocytosis, no anemia or significant platelet abnormality, COVID-19 and influenza PCR testing resulted negative, urine pregnancy negative, BMP and hepatic function panel were also  normal.  The patient had a D-dimer which was negative.  Low suspicion for PE at this time.  Chest x-ray was performed 2 views which did not reveal evidence of a bacterial pneumonia.  Symptoms are most consistent with a post viral acute bronchitis.  Advised continued symptomatic management at home.  I do not think that antibiotic administration is warranted at this time given the acute nature of the patient's bronchitis with no chest x-ray findings suggestive of a bacterial pneumonia.  Advised continued symptomatic management as the patient has been doing outpatient and PCP follow-up as needed.  Stable for discharge.   Final Clinical Impression(s) / ED Diagnoses Final diagnoses:  Shortness of breath  Bronchitis    Rx / DC Orders ED Discharge Orders     None        Regan Lemming, MD 10/03/21 2209

## 2021-10-03 NOTE — Discharge Instructions (Addendum)
You were evaluated in the Emergency Department and after careful evaluation, we did not find any emergent condition requiring admission or further testing in the hospital.  Your exam/testing today was overall reassuring.  Your chest x-ray did not show evidence of a bacterial pneumonia.  Your blood test that checks for blood clots was negative which means the likelihood of you having a blood clot is extremely low.  You likely have a post influenza bronchitis.  Continue symptomatic management with mucolytic's, cough suppressant medication as you are currently doing at home.  Please return to the Emergency Department if you experience any worsening of your condition.  Thank you for allowing [place] to be a part of your care.

## 2021-10-03 NOTE — ED Triage Notes (Signed)
Pt reports dx with the flu 1 week ago by [facility]ed[facility]. Started on tamiflu and nasal spray. States she has taken meds this week but has had worsening Shob since tuesday

## 2021-10-03 NOTE — ED Notes (Signed)
ED Provider at bedside. 

## 2022-11-23 IMAGING — CR DG CHEST 2V
2 series · 2 of 2 positions shown · non-contrast
Comparison: None.

CLINICAL DATA: Cough, shortness of breath

EXAM:
CHEST - 2 VIEW

[w chest pa]
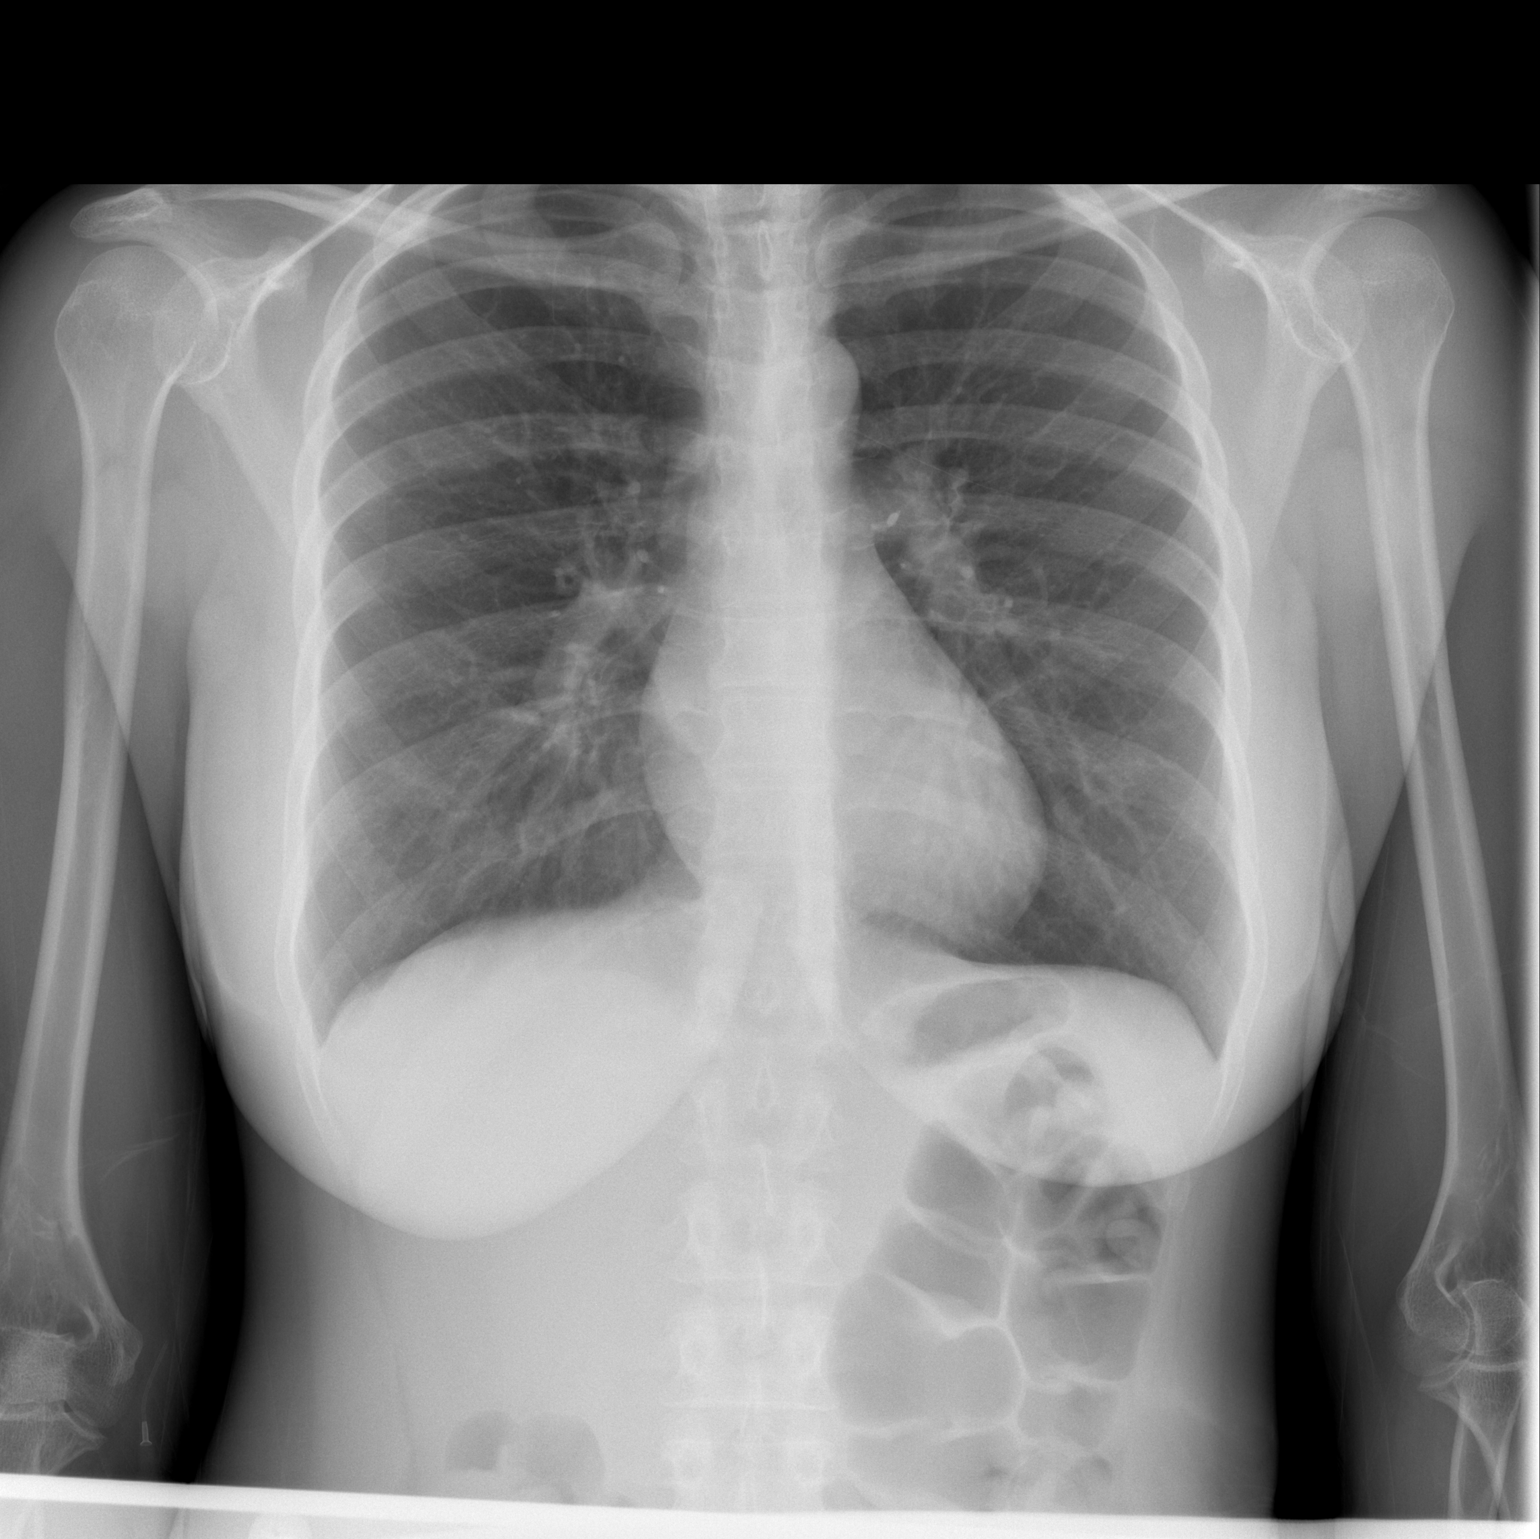

[w chest lat]
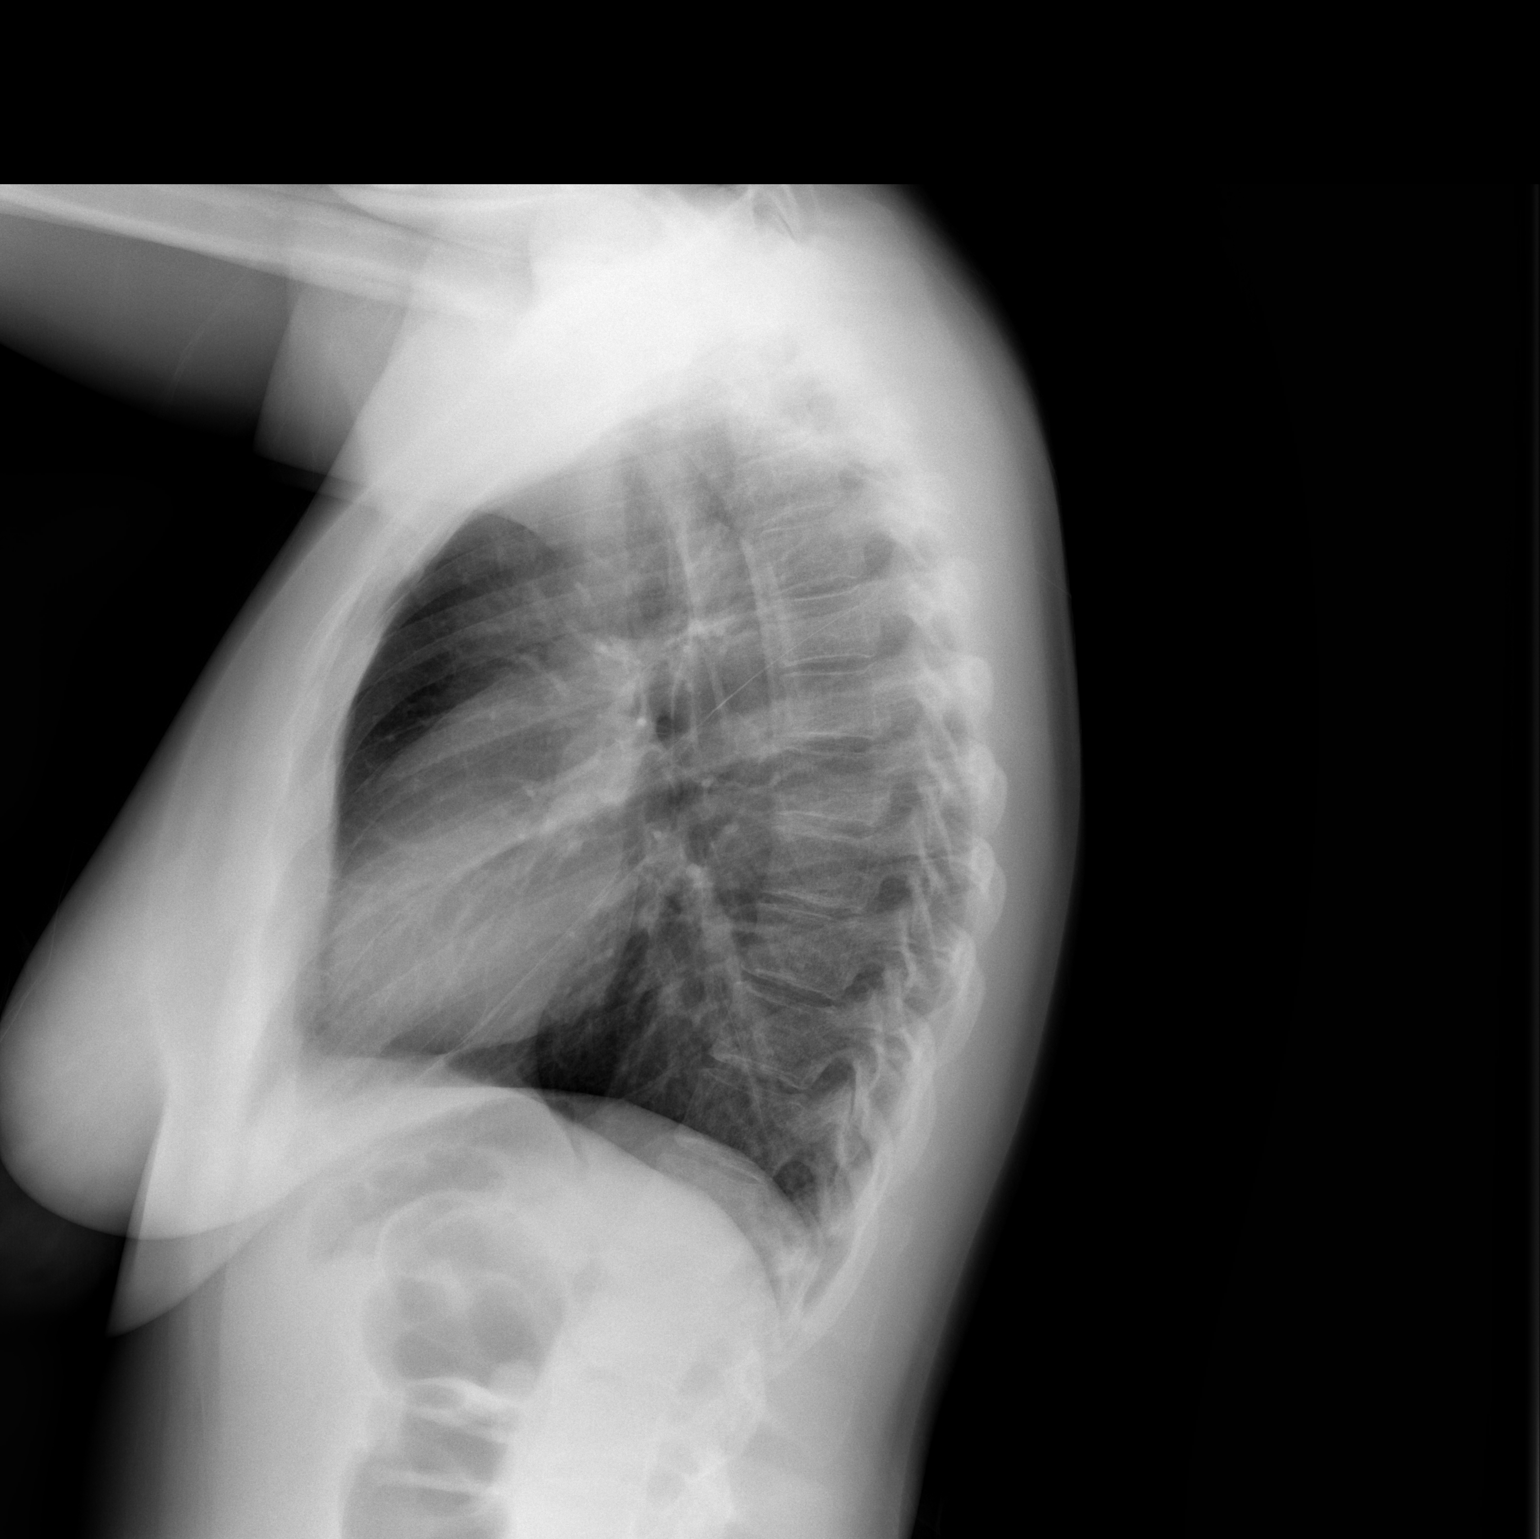

[2 of 2 positions shown; findings below may reference images not displayed]

FINDINGS: Lungs are clear.  No pleural effusion or pneumothorax.

The heart is normal in size.

Visualized osseous structures are within normal limits.
IMPRESSION: Normal chest radiographs.
